# Patient Record
Sex: Female | Born: 1970 | Race: White | Hispanic: No | Marital: Single | Smoking: Current every day smoker
Health system: Southern US, Community
[De-identification: ages and names within clinical notes are randomized; demographics above are authoritative.]

## PROBLEM LIST (undated history)

## (undated) DIAGNOSIS — E119 Type 2 diabetes mellitus without complications: Secondary | ICD-10-CM

## (undated) DIAGNOSIS — F32A Depression, unspecified: Secondary | ICD-10-CM

## (undated) DIAGNOSIS — R87629 Unspecified abnormal cytological findings in specimens from vagina: Secondary | ICD-10-CM

## (undated) DIAGNOSIS — F329 Major depressive disorder, single episode, unspecified: Secondary | ICD-10-CM

## (undated) HISTORY — PX: TONSILLECTOMY: SUR1361

## (undated) HISTORY — PX: CHOLECYSTECTOMY: SHX55

## (undated) HISTORY — DX: Unspecified abnormal cytological findings in specimens from vagina: R87.629

## (undated) HISTORY — PX: TONSILLECTOMY AND ADENOIDECTOMY: SHX28

---

## 2018-01-30 ENCOUNTER — Emergency Department (HOSPITAL_COMMUNITY)
Admission: EM | Admit: 2018-01-30 | Discharge: 2018-01-30 | Disposition: A | Discharge status: Home / Self Care | Payer: Medicaid Other | Attending: Emergency Medicine | Admitting: Emergency Medicine

## 2018-01-30 ENCOUNTER — Emergency Department (HOSPITAL_COMMUNITY): Payer: Medicaid Other

## 2018-01-30 ENCOUNTER — Encounter (HOSPITAL_COMMUNITY): Payer: Self-pay | Admitting: Emergency Medicine

## 2018-01-30 ENCOUNTER — Other Ambulatory Visit: Payer: Self-pay

## 2018-01-30 DIAGNOSIS — Y999 Unspecified external cause status: Secondary | ICD-10-CM | POA: Diagnosis not present

## 2018-01-30 DIAGNOSIS — W010XXA Fall on same level from slipping, tripping and stumbling without subsequent striking against object, initial encounter: Secondary | ICD-10-CM | POA: Diagnosis not present

## 2018-01-30 DIAGNOSIS — Y929 Unspecified place or not applicable: Secondary | ICD-10-CM | POA: Insufficient documentation

## 2018-01-30 DIAGNOSIS — S52592A Other fractures of lower end of left radius, initial encounter for closed fracture: Secondary | ICD-10-CM | POA: Diagnosis not present

## 2018-01-30 DIAGNOSIS — F17210 Nicotine dependence, cigarettes, uncomplicated: Secondary | ICD-10-CM | POA: Insufficient documentation

## 2018-01-30 DIAGNOSIS — Y939 Activity, unspecified: Secondary | ICD-10-CM | POA: Diagnosis not present

## 2018-01-30 DIAGNOSIS — S62102A Fracture of unspecified carpal bone, left wrist, initial encounter for closed fracture: Secondary | ICD-10-CM

## 2018-01-30 DIAGNOSIS — S6992XA Unspecified injury of left wrist, hand and finger(s), initial encounter: Secondary | ICD-10-CM | POA: Diagnosis present

## 2018-01-30 MED ORDER — IBUPROFEN 200 MG PO TABS
600.0000 mg | ORAL_TABLET | Freq: Once | ORAL | Status: AC | PRN
  Administered 2018-01-30: 600 mg via ORAL
  Filled 2018-01-30: qty 3

## 2018-01-30 MED ORDER — HYDROCODONE-ACETAMINOPHEN 5-325 MG PO TABS: 1.0000 | ORAL_TABLET | Freq: Four times a day (QID) | ORAL | 0 refills | Status: DC | PRN

## 2018-01-30 MED ORDER — HYDROCODONE-ACETAMINOPHEN 5-325 MG PO TABS
2.0000 | ORAL_TABLET | Freq: Once | ORAL | Status: AC
  Administered 2018-01-30: 2 via ORAL
  Filled 2018-01-30: qty 2

## 2018-01-30 NOTE — ED Triage Notes (Signed)
Pt presents s/p fall with complaints of left wrist pain. Patient was out with friends when she slipped and fell and caught herself with her left hand. No head injury or LOC. Swelling noted, PMS in tact.

## 2018-01-30 NOTE — ED Provider Notes (Signed)
[facility]-EMERGENCY DEPT Provider Note   CSN: [id number] Arrival date & time: 01/30/18  [id number]     History   Chief Complaint Chief Complaint  Patient presents with  . Fall  . Wrist Injury    HPI Debbie Hunter is a 47 y.o. female.  Patient presents to the emergency department with a chief complaint of left wrist pain.  She states that she fell on an outstretched hand after tripping over a shoe.  She complains of moderate pain.  Denies any numbness or tingling.  Denies any treatments prior to arrival.  The history is provided by the patient. No language interpreter was used.    History reviewed. No pertinent past medical history.  There are no active problems to display for this patient.   Past Surgical History:  Procedure Laterality Date  . CHOLECYSTECTOMY    . TONSILLECTOMY    . TONSILLECTOMY AND ADENOIDECTOMY       OB History   No obstetric history on file.      Home Medications    Prior to Admission medications   Not on File    Family History No family history on file.  Social History Social History   Tobacco Use  . Smoking status: Current Every Day Smoker    Packs/day: 0.50    Types: Cigarettes  . Smokeless tobacco: Never Used  Substance Use Topics  . Alcohol use: Yes  . Drug use: Never     Allergies   Patient has no known allergies.   Review of Systems Review of Systems  All other systems reviewed and are negative.    Physical Exam Updated Vital Signs BP 104/63 (BP Location: Left Arm)   Pulse 75   Temp 97.6 F (36.4 C) (Oral)   Resp 15   Ht 5\' 7"  (1.702 m)   Wt 89.4 kg   LMP 01/03/2018   SpO2 96%   BMI 30.85 kg/m   Physical Exam Nursing note and vitals reviewed.  Constitutional: Pt appears well-developed and well-nourished. No distress.  HENT:  Head: Normocephalic and atraumatic.  Eyes: Conjunctivae are normal.  Neck: Normal range of motion.  Cardiovascular: Normal rate, regular rhythm. Intact  distal pulses.   Capillary refill < 3 sec.  Pulmonary/Chest: Effort normal and breath sounds normal.  Musculoskeletal:  Left wrist Pt exhibits tender to palpation over the radial aspect with mild swelling.   ROM: Limited secondary to pain  Strength: Deferred Neurological: Pt  is alert. Coordination normal.  Sensation: 5/5 Skin: Skin is warm and dry. Pt is not diaphoretic.  No evidence of open wound or skin tenting Psychiatric: Pt has a normal mood and affect.     ED Treatments / Results  Labs (all labs ordered are listed, but only abnormal results are displayed) Labs Reviewed - No data to display  EKG None  Radiology Dg Wrist Complete Left  Result Date: 01/30/2018 CLINICAL DATA:  Status post fall, with left wrist pain. Initial encounter. EXAM: LEFT WRIST - COMPLETE 3+ VIEW COMPARISON:  None. FINDINGS: There is a comminuted fracture at the distal radius, extending to the radiocarpal joint. There is also a minimally displaced fracture of the ulnar styloid. No significant angulation is seen. Surrounding soft tissue swelling is noted. The carpal rows are intact, and demonstrate normal alignment. The joint spaces are preserved. IMPRESSION: Comminuted fracture at the distal radius, extending to the radiocarpal joint. Minimally displaced fracture of the ulnar styloid. Electronically Signed   By: Beryle BeamsJeffery  Chang M.D.  On: 01/30/2018 05:16    Procedures Procedures (including critical care time)  Medications Ordered in ED Medications  HYDROcodone-acetaminophen (NORCO/VICODIN) 5-325 MG per tablet 2 tablet (has no administration in time range)  ibuprofen (ADVIL,MOTRIN) tablet 600 mg (600 mg Oral Given 01/30/18 0511)     Initial Impression / Assessment and Plan / ED Course  I have reviewed the triage vital signs and the nursing notes.  Pertinent labs & imaging results that were available during my care of the patient were reviewed by me and considered in my medical decision making (see  chart for details).     Plain films reveal comminuted fracture of the distal radius, there is also a minimally displaced ulnar styloid fracture.  Patient given sugar tong splint and sling and instructed to follow-up with orthopedic hand surgery.  Final Clinical Impressions(s) / ED Diagnoses   Final diagnoses:  Closed fracture of left wrist, initial encounter    ED Discharge Orders         Ordered    HYDROcodone-acetaminophen (NORCO/VICODIN) 5-325 MG tablet  Every 6 hours PRN     01/30/18 0607           Roxy HorsemanBrowning, Nickalos Petersen, PA-C 01/30/18 [id number]    Zadie RhineWickline, Donald, MD 01/30/18 [phone]

## 2018-02-01 ENCOUNTER — Other Ambulatory Visit: Payer: Self-pay | Admitting: Orthopedic Surgery

## 2018-02-01 ENCOUNTER — Other Ambulatory Visit: Payer: Self-pay

## 2018-02-01 ENCOUNTER — Encounter (HOSPITAL_BASED_OUTPATIENT_CLINIC_OR_DEPARTMENT_OTHER): Payer: Self-pay | Admitting: *Deleted

## 2018-02-04 ENCOUNTER — Encounter (HOSPITAL_BASED_OUTPATIENT_CLINIC_OR_DEPARTMENT_OTHER)
Admission: RE | Disposition: A | Discharge status: Home / Self Care | Payer: Self-pay | Source: Home / Self Care | Attending: Orthopedic Surgery

## 2018-02-04 ENCOUNTER — Ambulatory Visit (HOSPITAL_BASED_OUTPATIENT_CLINIC_OR_DEPARTMENT_OTHER): Payer: Medicaid Other | Admitting: Certified Registered"

## 2018-02-04 ENCOUNTER — Encounter (HOSPITAL_BASED_OUTPATIENT_CLINIC_OR_DEPARTMENT_OTHER): Payer: Self-pay | Admitting: Certified Registered"

## 2018-02-04 ENCOUNTER — Other Ambulatory Visit: Payer: Self-pay

## 2018-02-04 ENCOUNTER — Ambulatory Visit (HOSPITAL_BASED_OUTPATIENT_CLINIC_OR_DEPARTMENT_OTHER)
Admission: RE | Admit: 2018-02-04 | Discharge: 2018-02-04 | Disposition: A | Discharge status: Home / Self Care | Payer: Medicaid Other | Attending: Orthopedic Surgery | Admitting: Orthopedic Surgery

## 2018-02-04 DIAGNOSIS — Z79899 Other long term (current) drug therapy: Secondary | ICD-10-CM | POA: Insufficient documentation

## 2018-02-04 DIAGNOSIS — Z7984 Long term (current) use of oral hypoglycemic drugs: Secondary | ICD-10-CM | POA: Diagnosis not present

## 2018-02-04 DIAGNOSIS — F17210 Nicotine dependence, cigarettes, uncomplicated: Secondary | ICD-10-CM | POA: Diagnosis not present

## 2018-02-04 DIAGNOSIS — W1830XA Fall on same level, unspecified, initial encounter: Secondary | ICD-10-CM | POA: Insufficient documentation

## 2018-02-04 DIAGNOSIS — S52572A Other intraarticular fracture of lower end of left radius, initial encounter for closed fracture: Secondary | ICD-10-CM | POA: Diagnosis not present

## 2018-02-04 DIAGNOSIS — E119 Type 2 diabetes mellitus without complications: Secondary | ICD-10-CM | POA: Diagnosis not present

## 2018-02-04 DIAGNOSIS — F329 Major depressive disorder, single episode, unspecified: Secondary | ICD-10-CM | POA: Diagnosis not present

## 2018-02-04 DIAGNOSIS — S52572K Other intraarticular fracture of lower end of left radius, subsequent encounter for closed fracture with nonunion: Secondary | ICD-10-CM | POA: Diagnosis present

## 2018-02-04 HISTORY — DX: Depression, unspecified: F32.A

## 2018-02-04 HISTORY — PX: OPEN REDUCTION INTERNAL FIXATION (ORIF) DISTAL RADIAL FRACTURE: SHX5989

## 2018-02-04 HISTORY — DX: Type 2 diabetes mellitus without complications: E11.9

## 2018-02-04 HISTORY — DX: Major depressive disorder, single episode, unspecified: F32.9

## 2018-02-04 SURGERY — OPEN REDUCTION INTERNAL FIXATION (ORIF) DISTAL RADIUS FRACTURE
Anesthesia: Monitor Anesthesia Care | Site: Arm Lower | Laterality: Left

## 2018-02-04 MED ORDER — CHLORHEXIDINE GLUCONATE 4 % EX LIQD: 60.0000 mL | Freq: Once | CUTANEOUS | Status: DC

## 2018-02-04 MED ORDER — MIDAZOLAM HCL 2 MG/2ML IJ SOLN
INTRAMUSCULAR | Status: AC
  Filled 2018-02-04: qty 2

## 2018-02-04 MED ORDER — HYDROMORPHONE HCL 1 MG/ML IJ SOLN: 0.2500 mg | INTRAMUSCULAR | Status: DC | PRN

## 2018-02-04 MED ORDER — CEFAZOLIN SODIUM-DEXTROSE 2-4 GM/100ML-% IV SOLN
INTRAVENOUS | Status: AC
  Filled 2018-02-04: qty 100

## 2018-02-04 MED ORDER — LIDOCAINE HCL (CARDIAC) PF 100 MG/5ML IV SOSY
PREFILLED_SYRINGE | INTRAVENOUS | Status: DC | PRN
  Administered 2018-02-04: 30 mg via INTRAVENOUS

## 2018-02-04 MED ORDER — FENTANYL CITRATE (PF) 100 MCG/2ML IJ SOLN
50.0000 ug | INTRAMUSCULAR | Status: DC | PRN
  Administered 2018-02-04: 100 ug via INTRAVENOUS

## 2018-02-04 MED ORDER — MEPERIDINE HCL 25 MG/ML IJ SOLN: 6.2500 mg | INTRAMUSCULAR | Status: DC | PRN

## 2018-02-04 MED ORDER — CEFAZOLIN SODIUM-DEXTROSE 2-4 GM/100ML-% IV SOLN
2.0000 g | INTRAVENOUS | Status: AC
  Administered 2018-02-04: 2 g via INTRAVENOUS

## 2018-02-04 MED ORDER — 0.9 % SODIUM CHLORIDE (POUR BTL) OPTIME
TOPICAL | Status: DC | PRN
  Administered 2018-02-04: 1000 mL

## 2018-02-04 MED ORDER — FENTANYL CITRATE (PF) 100 MCG/2ML IJ SOLN
INTRAMUSCULAR | Status: AC
  Filled 2018-02-04: qty 2

## 2018-02-04 MED ORDER — MIDAZOLAM HCL 2 MG/2ML IJ SOLN
1.0000 mg | INTRAMUSCULAR | Status: DC | PRN
  Administered 2018-02-04: 2 mg via INTRAVENOUS

## 2018-02-04 MED ORDER — PROPOFOL 500 MG/50ML IV EMUL
INTRAVENOUS | Status: DC | PRN
  Administered 2018-02-04: 4 ug/kg/min via INTRAVENOUS
  Administered 2018-02-04: 75 ug/kg/min via INTRAVENOUS

## 2018-02-04 MED ORDER — SCOPOLAMINE 1 MG/3DAYS TD PT72: 1.0000 | MEDICATED_PATCH | Freq: Once | TRANSDERMAL | Status: DC | PRN

## 2018-02-04 MED ORDER — ROPIVACAINE HCL 5 MG/ML IJ SOLN
INTRAMUSCULAR | Status: DC | PRN
  Administered 2018-02-04: 30 mL via PERINEURAL

## 2018-02-04 MED ORDER — LACTATED RINGERS IV SOLN
INTRAVENOUS | Status: DC
  Administered 2018-02-04: 09:00:00 via INTRAVENOUS

## 2018-02-04 MED ORDER — OXYCODONE HCL 5 MG/5ML PO SOLN: 5.0000 mg | Freq: Once | ORAL | Status: DC | PRN

## 2018-02-04 MED ORDER — PROMETHAZINE HCL 25 MG/ML IJ SOLN: 6.2500 mg | INTRAMUSCULAR | Status: DC | PRN

## 2018-02-04 MED ORDER — HYDROCODONE-ACETAMINOPHEN 5-325 MG PO TABS: ORAL_TABLET | ORAL | 0 refills | Status: DC

## 2018-02-04 MED ORDER — OXYCODONE HCL 5 MG PO TABS: 5.0000 mg | ORAL_TABLET | Freq: Once | ORAL | Status: DC | PRN

## 2018-02-04 SURGICAL SUPPLY — 58 items
BANDAGE ACE 3X5.8 VEL STRL LF (GAUZE/BANDAGES/DRESSINGS) ×3 IMPLANT
BIT DRILL 2.0 LNG QUCK RELEASE (BIT) ×1 IMPLANT
BIT DRILL 2.8X5 QR DISP (BIT) ×3 IMPLANT
BLADE SURG 15 STRL LF DISP TIS (BLADE) ×2 IMPLANT
BLADE SURG 15 STRL SS (BLADE) ×4
BNDG ESMARK 4X9 LF (GAUZE/BANDAGES/DRESSINGS) ×3 IMPLANT
BNDG GAUZE ELAST 4 BULKY (GAUZE/BANDAGES/DRESSINGS) ×3 IMPLANT
BNDG PLASTER X FAST 3X3 WHT LF (CAST SUPPLIES) IMPLANT
CHLORAPREP W/TINT 26ML (MISCELLANEOUS) ×3 IMPLANT
CORD BIPOLAR FORCEPS 12FT (ELECTRODE) ×3 IMPLANT
COVER BACK TABLE 60X90IN (DRAPES) ×3 IMPLANT
COVER MAYO STAND STRL (DRAPES) ×3 IMPLANT
COVER WAND RF STERILE (DRAPES) IMPLANT
CUFF TOURNIQUET SINGLE 18IN (TOURNIQUET CUFF) IMPLANT
CUFF TOURNIQUET SINGLE 24IN (TOURNIQUET CUFF) ×3 IMPLANT
DRAPE EXTREMITY T 121X128X90 (DRAPE) ×3 IMPLANT
DRAPE OEC MINIVIEW 54X84 (DRAPES) ×3 IMPLANT
DRAPE SURG 17X23 STRL (DRAPES) ×3 IMPLANT
DRILL 2.0 LNG QUICK RELEASE (BIT) ×3
GAUZE SPONGE 4X4 12PLY STRL (GAUZE/BANDAGES/DRESSINGS) ×3 IMPLANT
GAUZE XEROFORM 1X8 LF (GAUZE/BANDAGES/DRESSINGS) ×3 IMPLANT
GLOVE BIO SURGEON STRL SZ7.5 (GLOVE) ×3 IMPLANT
GLOVE BIOGEL PI IND STRL 8 (GLOVE) ×1 IMPLANT
GLOVE BIOGEL PI IND STRL 8.5 (GLOVE) IMPLANT
GLOVE BIOGEL PI INDICATOR 8 (GLOVE) ×2
GLOVE BIOGEL PI INDICATOR 8.5 (GLOVE)
GLOVE SURG ORTHO 8.0 STRL STRW (GLOVE) IMPLANT
GOWN STRL REUS W/ TWL LRG LVL3 (GOWN DISPOSABLE) ×1 IMPLANT
GOWN STRL REUS W/TWL LRG LVL3 (GOWN DISPOSABLE) ×2
GOWN STRL REUS W/TWL XL LVL3 (GOWN DISPOSABLE) ×3 IMPLANT
GUIDEWIRE ORTHO 0.054X6 (WIRE) ×9 IMPLANT
NEEDLE HYPO 25X1 1.5 SAFETY (NEEDLE) IMPLANT
NS IRRIG 1000ML POUR BTL (IV SOLUTION) ×3 IMPLANT
PACK BASIN DAY SURGERY FS (CUSTOM PROCEDURE TRAY) ×3 IMPLANT
PAD CAST 3X4 CTTN HI CHSV (CAST SUPPLIES) ×1 IMPLANT
PADDING CAST COTTON 3X4 STRL (CAST SUPPLIES) ×2
PLATE ACU LOC PROX STD LEFT (Plate) ×3 IMPLANT
SCREW CORT FT 18X2.3XLCK HEX (Screw) ×2 IMPLANT
SCREW CORTICAL LOCKING 2.3X18M (Screw) ×8 IMPLANT
SCREW CORTICAL LOCKING 2.3X20M (Screw) ×4 IMPLANT
SCREW FX18X2.3XSMTH LCK NS CRT (Screw) ×2 IMPLANT
SCREW FX20X2.3XSMTH LCK NS CRT (Screw) ×2 IMPLANT
SCREW HEX 3.5X15 NLCKG STRL (Screw) ×1 IMPLANT
SCREW HEX 3.5X15MM (Screw) ×3 IMPLANT
SCREW HEXALOBE NON-LOCK 3.5X16 (Screw) ×3 IMPLANT
SCREW NONLOCK HEX 3.5X12 (Screw) ×3 IMPLANT
SLEEVE SCD COMPRESS KNEE MED (MISCELLANEOUS) ×3 IMPLANT
SLING ARM FOAM STRAP LRG (SOFTGOODS) ×3 IMPLANT
SPLINT PLASTER CAST XFAST 3X15 (CAST SUPPLIES) ×10 IMPLANT
SPLINT PLASTER XTRA FASTSET 3X (CAST SUPPLIES) ×20
STOCKINETTE 4X48 STRL (DRAPES) ×3 IMPLANT
SUT ETHILON 4 0 PS 2 18 (SUTURE) ×3 IMPLANT
SUT VICRYL 4-0 PS2 18IN ABS (SUTURE) ×3 IMPLANT
SYR BULB 3OZ (MISCELLANEOUS) ×3 IMPLANT
SYR CONTROL 10ML LL (SYRINGE) IMPLANT
TOWEL GREEN STERILE FF (TOWEL DISPOSABLE) ×6 IMPLANT
TOWEL OR NON WOVEN STRL DISP B (DISPOSABLE) ×3 IMPLANT
UNDERPAD 30X30 (UNDERPADS AND DIAPERS) ×3 IMPLANT

## 2018-02-04 NOTE — Discharge Instructions (Addendum)
Post Anesthesia Home Care Instructions  Activity: Get plenty of rest for the remainder of the day. A responsible individual must stay with you for 24 hours following the procedure.  For the next 24 hours, DO NOT: -Drive a car -Operate machinery -Drink alcoholic beverages -Take any medication unless instructed by your physician -Make any legal decisions or sign important papers.  Meals: Start with liquid foods such as gelatin or soup. Progress to regular foods as tolerated. Avoid greasy, spicy, heavy foods. If nausea and/or vomiting occur, drink only clear liquids until the nausea and/or vomiting subsides. Call your physician if vomiting continues.  Special Instructions/Symptoms: Your throat may feel dry or sore from the anesthesia or the breathing tube placed in your throat during surgery. If this causes discomfort, gargle with warm salt water. The discomfort should disappear within 24 hours.  If you had a scopolamine patch placed behind your ear for the management of post- operative nausea and/or vomiting:  1. The medication in the patch is effective for 72 hours, after which it should be removed.  Wrap patch in a tissue and discard in the trash. Wash hands thoroughly with soap and water. 2. You may remove the patch earlier than 72 hours if you experience unpleasant side effects which may include dry mouth, dizziness or visual disturbances. 3. Avoid touching the patch. Wash your hands with soap and water after contact with the patch.      Regional Anesthesia Blocks  1. Numbness or the inability to move the "blocked" extremity may last from 3-48 hours after placement. The length of time depends on the medication injected and your individual response to the medication. If the numbness is not going away after 48 hours, call your surgeon.  2. The extremity that is blocked will need to be protected until the numbness is gone and the  Strength has returned. Because you cannot feel it, you  will need to take extra care to avoid injury. Because it may be weak, you may have difficulty moving it or using it. You may not know what position it is in without looking at it while the block is in effect.  3. For blocks in the legs and feet, returning to weight bearing and walking needs to be done carefully. You will need to wait until the numbness is entirely gone and the strength has returned. You should be able to move your leg and foot normally before you try and bear weight or walk. You will need someone to be with you when you first try to ensure you do not fall and possibly risk injury.  4. Bruising and tenderness at the needle site are common side effects and will resolve in a few days.  5. Persistent numbness or new problems with movement should be communicated to the surgeon or the [facility] ([phone])/ [facility] ([phone]).    Hand Center Instructions Hand Surgery  Wound Care: Keep your hand elevated above the level of your heart.  Do not allow it to dangle by your side.  Keep the dressing dry and do not remove it unless your doctor advises you to do so.  He will usually change it at the time of your post-op visit.  Moving your fingers is advised to stimulate circulation but will depend on the site of your surgery.  If you have a splint applied, your doctor will advise you regarding movement.  Activity: Do not drive or operate machinery today.  Rest today and   then you may return to your normal activity and work as indicated by your physician.  Diet:  Drink liquids today or eat a light diet.  You may resume a regular diet tomorrow.    General expectations: Pain for two to three days. Fingers may become slightly swollen.  Call your doctor if any of the following occur: Severe pain not relieved by pain medication. Elevated temperature. Dressing soaked with blood. Inability to move fingers. White or bluish color to fingers.  

## 2018-02-04 NOTE — Progress Notes (Signed)
Assisted Dr. Miller with left, ultrasound guided, supraclavicular block. Side rails up, monitors on throughout procedure. See vital signs in flow sheet. Tolerated Procedure well. 

## 2018-02-04 NOTE — Anesthesia Procedure Notes (Signed)
Anesthesia Regional Block: Supraclavicular block   Pre-Anesthetic Checklist: ,, timeout performed, Correct Patient, Correct Site, Correct Laterality, Correct Procedure, Correct Position, site marked, Risks and benefits discussed,  Surgical consent,  Pre-op evaluation,  At surgeon's request and post-op pain management  Laterality: Left  Prep: chloraprep       Needles:  Injection technique: Single-shot  Needle Type: Stimiplex     Needle Length: 9cm  Needle Gauge: 21     Additional Needles:   Procedures:,,,, ultrasound used (permanent image in chart),,,,  Narrative:  Start time: 02/04/2018 9:11 AM End time: 02/04/2018 9:16 AM Injection made incrementally with aspirations every 5 mL.  Performed by: Personally  Anesthesiologist: Lowella CurbMiller, Bernisha Verma Ray, MD

## 2018-02-04 NOTE — Anesthesia Procedure Notes (Signed)
Procedure Name: MAC Date/Time: 02/04/2018 10:35 AM Performed by: Signe Colt, CRNA Pre-anesthesia Checklist: Patient identified, Emergency Drugs available, Suction available and Patient being monitored Patient Re-evaluated:Patient Re-evaluated prior to induction Oxygen Delivery Method: Simple face mask

## 2018-02-04 NOTE — Transfer of Care (Signed)
Immediate Anesthesia Transfer of Care Note  Patient: Hydrographic surveyor  Procedure(s) Performed: OPEN REDUCTION INTERNAL FIXATION (ORIF) LEFT DISTAL RADIAL FRACTURE (Left Arm Lower)  Patient Location: PACU  Anesthesia Type:MAC combined with regional for post-op pain  Level of Consciousness: awake, alert , oriented and patient cooperative  Airway & Oxygen Therapy: Patient Spontanous Breathing and Patient connected to face mask oxygen  Post-op Assessment: Report given to RN and Post -op Vital signs reviewed and stable  Post vital signs: Reviewed and stable  Last Vitals:  Vitals Value Taken Time  BP    Temp    Pulse 62 02/04/2018 11:36 AM  Resp 16 02/04/2018 11:36 AM  SpO2 99 % 02/04/2018 11:36 AM  Vitals shown include unvalidated device data.  Last Pain:  Vitals:   02/04/18 0844  TempSrc: Oral  PainSc: 2          Complications: No apparent anesthesia complications

## 2018-02-04 NOTE — H&P (Signed)
  Debbie Hunter is an 47 y.o. female.   Chief Complaint: left wrist fracture HPI: 47 yo female states she fell from standing height 1 week ago injuring left wrist.  Seen in ED where XR revealed distal radius fracture.  Splinted and followed up in office.  She wishes to proceed with operative fixation.  Allergies: No Known Allergies  Past Medical History:  Diagnosis Date  . Depression   . Diabetes mellitus without complication [facility](HCC)     Past Surgical History:  Procedure Laterality Date  . CHOLECYSTECTOMY    . TONSILLECTOMY    . TONSILLECTOMY AND ADENOIDECTOMY      Family History: History reviewed. No pertinent family history.  Social History:   reports that she has been smoking cigarettes. She has been smoking about 0.50 packs per day. She has never used smokeless tobacco. She reports current alcohol use. She reports that she does not use drugs.  Medications: Medications Prior to Admission  Medication Sig Dispense Refill  . cholecalciferol (VITAMIN D3) 25 MCG (1000 UT) tablet Take 1,000 Units by mouth daily.    Marland Kitchen. escitalopram (LEXAPRO) 20 MG tablet Take 20 mg by mouth daily.    Marland Kitchen. HYDROcodone-acetaminophen (NORCO/VICODIN) 5-325 MG tablet Take 1-2 tablets by mouth every 6 (six) hours as needed. 15 tablet 0  . metFORMIN (GLUCOPHAGE) 1000 MG tablet Take 1,000 mg by mouth daily with breakfast.      No results found for this or any previous visit (from the past 48 hour(s)).  No results found.   A comprehensive review of systems was negative.  Blood pressure (!) 103/58, pulse 66, temperature 98 F (36.7 C), temperature source Oral, resp. rate 16, height 5\' 7"  (1.702 m), weight (!) 143.2 kg, last menstrual period 01/28/2018, SpO2 98 %.  General appearance: alert, cooperative and appears stated age Head: Normocephalic, without obvious abnormality, atraumatic Neck: supple, symmetrical, trachea midline Cardio: regular rate and rhythm Resp: clear to auscultation  bilaterally Extremities: Intact sensation and capillary refill all digits.  +epl/fpl/io.  No wounds.  Pulses: 2+ and symmetric Skin: Skin color, texture, turgor normal. No rashes or lesions Neurologic: Grossly normal Incision/Wound: none  Assessment/Plan Left distal radius fracture.  Non operative and operative treatment options have been discussed with the patient and patient wishes to proceed with operative treatment. Risks, benefits, and alternatives of surgery have been discussed and the patient agrees with the plan of care.   Betha LoaKevin Noelle Sease 02/04/2018, 10:17 AM

## 2018-02-04 NOTE — Anesthesia Postprocedure Evaluation (Signed)
Anesthesia Post Note  Patient: Product/process development scientistDeanna Fauble  Procedure(s) Performed: OPEN REDUCTION INTERNAL FIXATION (ORIF) LEFT DISTAL RADIAL FRACTURE (Left Arm Lower)     Patient location during evaluation: PACU Anesthesia Type: Regional Level of consciousness: awake and alert Pain management: pain level controlled Vital Signs Assessment: post-procedure vital signs reviewed and stable Respiratory status: spontaneous breathing, nonlabored ventilation and respiratory function stable Cardiovascular status: blood pressure returned to baseline and stable Postop Assessment: no apparent nausea or vomiting Anesthetic complications: no    Last Vitals:  Vitals:   02/04/18 1147 02/04/18 1225  BP: 120/77 (!) 115/95  Pulse: (!) 59 60  Resp: (!) 23   Temp:  37 C  SpO2: 94% 100%    Last Pain:  Vitals:   02/04/18 1225  TempSrc: Oral  PainSc: 0-No pain                 Lowella CurbWarren Ray Ewan Grau

## 2018-02-04 NOTE — Op Note (Signed)
02/04/2018 [facility]  Operative Note  Pre Op Diagnosis: Left comminuted intraarticular distal radius fracture  Post Op Diagnosis: Left comminuted intraarticular distal radius fracture  Procedure: ORIF Left comminuted intraarticular distal radius fracture, 3 intraarticular fragments  Surgeon: Betha LoaKevin Woodson Macha, MD  Assistant: none  Anesthesia: Regional with sedation  Fluids: Per anesthesia flow sheet  EBL: minimal  Complications: None  Specimen: None  Tourniquet Time:  Total Tourniquet Time Documented: Upper Arm (Left) - 49 minutes Total: Upper Arm (Left) - 49 minutes   Disposition: Stable to PACU  INDICATIONS:  Debbie Hunter is a 47 y.o. female states she fell from standing height 1 week ago injuring left wrist.  Seen in ED where XR revealed distal radius fracture.  Splinted and followed up in office.  We discussed nonoperative and operative treatment options.  She wished to proceed with operative fixation.  Risks, benefits, and alternatives of surgery were discussed including the risk of blood loss; infection; damage to nerves, vessels, tendons, ligaments, bone; failure of surgery; need for additional surgery; complications with wound healing; continued pain; nonunion; malunion; stiffness.  She voiced understanding of these risks and elected to proceed.    OPERATIVE COURSE:  After being identified preoperatively by myself, the patient and I agreed upon the procedure and site of procedure.  Surgical site was marked.  The risks, benefits and alternatives of the surgery were reviewed and she wished to proceed.  Surgical consent had been signed.  She was given IV Ancef as preoperative antibiotic prophylaxis.  She was transferred to the operating room and placed on the operating room table in supine position with the Left upper extremity on an armboard. Sedation was induced by the anesthesiologist.  A regional block had been performed by anesthesia in preoperative holding.   The Left upper extremity was prepped and draped in normal sterile orthopedic fashion.  A surgical pause was performed between the surgeons, anesthesia and operating room staff, and all were in agreement as to the patient, procedure and site of procedure.  Tourniquet at the proximal aspect of the extremity was inflated to 250 mmHg after exsanguination of the limb with an Esmarch bandage.  Standard volar Debbie Hunter approach was used.  The bipolar electrocautery was used to obtain hemostasis.  The superficial and deep portions of the FCR tendon sheath were incised, and the FCR and FPL were swept ulnarly to protect the palmar cutaneous branch of the median nerve.  The brachioradialis was released at the radial side of the radius.  The pronator quadratus was released and elevated with the periosteal elevator.  The fracture site was identified and cleared of soft tissue interposition and hematoma.  It was reduced under direct visualization.  There was intraarticular extension creating three intraarticular fragments.  An AcuMed volar distal radial locking plate was selected.  It was secured to the bone with the guidepins.  C-arm was used in AP and lateral projections to ensure appropriate reduction and position of the hardware and adjustments made as necessary.  Standard AO drilling and measuring technique was used.  A single screw was placed in the slotted hole in the shaft of the plate.  The distal holes were filled with locking pegs with the exception of the styloid holes, which were filled with locking screws.  The remaining holes in the shaft of the plate were filled with nonlocking screws.  Good purchase was obtained.  C-arm was used in AP, lateral and oblique projections to ensure appropriate reduction and position of hardware,  which was the case.  There was no intra-articular penetration of hardware.  The wound was copiously irrigated with sterile saline.  Pronator quadratus was repaired back over top of the plate  using 4-0 Vicryl suture.  Vicryl suture was placed in the subcutaneous tissues in an inverted interrupted fashion and the skin was closed with 4-0 nylon in a horizontal mattress fashion.  There was good pronation and supination of the wrist without crepitance.  The wound was then dressed with sterile Xeroform, 4x4s, and wrapped with a Kerlix bandage.  A volar splint was placed and wrapped with Kerlix and Ace bandage.  Tourniquet was deflated at 49 minutes.  Fingertips were pink with brisk capillary refill after deflation of the tourniquet.  Operative drapes were broken down.  The patient was awoken from anesthesia safely.  She was transferred back to the stretcher and taken to the PACU in stable condition.  I will see her back in the office in one week for postoperative followup.  I will give her a prescription for Norco 5/325 1-2 tabs PO q6 hours prn pain, dispense # 30.    Betha LoaKevin Gordie Crumby, MD Electronically signed, 02/04/18

## 2018-02-04 NOTE — OR Nursing (Signed)
Acumed Acu-loc 2 Wrist pating system opened on the sterile field.

## 2018-02-04 NOTE — Anesthesia Preprocedure Evaluation (Signed)
Anesthesia Evaluation  Patient identified by MRN, date of birth, ID band Patient awake    Reviewed: Allergy & Precautions, NPO status , Patient's Chart, lab work & pertinent test results  Airway Mallampati: II  TM Distance: >3 FB Neck ROM: Full    Dental no notable dental hx.    Pulmonary neg pulmonary ROS, Current Smoker,    Pulmonary exam normal breath sounds clear to auscultation       Cardiovascular negative cardio ROS Normal cardiovascular exam Rhythm:Regular Rate:Normal     Neuro/Psych Depression negative neurological ROS  negative psych ROS   GI/Hepatic negative GI ROS, Neg liver ROS,   Endo/Other  diabetes, Type 2, Oral Hypoglycemic AgentsMorbid obesity  Renal/GU negative Renal ROS  negative genitourinary   Musculoskeletal negative musculoskeletal ROS (+)   Abdominal (+) + obese,   Peds negative pediatric ROS (+)  Hematology negative hematology ROS (+)   Anesthesia Other Findings   Reproductive/Obstetrics negative OB ROS                             Anesthesia Physical Anesthesia Plan  ASA: III  Anesthesia Plan: Regional and MAC   Post-op Pain Management:    Induction: Intravenous  PONV Risk Score and Plan: 1 and Ondansetron  Airway Management Planned: Simple Face Mask  Additional Equipment:   Intra-op Plan:   Post-operative Plan:   Informed Consent: I have reviewed the patients History and Physical, chart, labs and discussed the procedure including the risks, benefits and alternatives for the proposed anesthesia with the patient or authorized representative who has indicated his/her understanding and acceptance.   Dental advisory given  Plan Discussed with: CRNA  Anesthesia Plan Comments:         Anesthesia Quick Evaluation

## 2018-02-05 ENCOUNTER — Encounter (HOSPITAL_BASED_OUTPATIENT_CLINIC_OR_DEPARTMENT_OTHER): Payer: Self-pay | Admitting: Orthopedic Surgery

## 2018-02-12 LAB — POCT I-STAT, CHEM 8
BUN: 10 mg/dL (ref 6–20)
Calcium, Ion: 1.14 mmol/L — ABNORMAL LOW (ref 1.15–1.40)
Chloride: 106 mmol/L (ref 98–111)
Creatinine, Ser: 0.6 mg/dL (ref 0.44–1.00)
Glucose, Bld: 94 mg/dL (ref 70–99)
HCT: 44 % (ref 36.0–46.0)
Hemoglobin: 15 g/dL (ref 12.0–15.0)
POTASSIUM: 4.1 mmol/L (ref 3.5–5.1)
Sodium: 139 mmol/L (ref 135–145)
TCO2: 25 mmol/L (ref 22–32)

## 2018-03-31 ENCOUNTER — Ambulatory Visit: Payer: Medicaid Other | Admitting: Obstetrics & Gynecology

## 2018-04-06 ENCOUNTER — Ambulatory Visit: Payer: Medicaid Other | Admitting: Obstetrics and Gynecology

## 2018-04-06 ENCOUNTER — Encounter: Payer: Self-pay | Admitting: Obstetrics and Gynecology

## 2018-04-06 ENCOUNTER — Other Ambulatory Visit (HOSPITAL_COMMUNITY)
Admission: RE | Admit: 2018-04-06 | Discharge: 2018-04-06 | Disposition: A | Discharge status: Home / Self Care | Payer: Medicaid Other | Source: Ambulatory Visit | Attending: Obstetrics and Gynecology | Admitting: Obstetrics and Gynecology

## 2018-04-06 VITALS — BP 126/80 | HR 65 | Ht 67.0 in | Wt 308.0 lb

## 2018-04-06 DIAGNOSIS — Z30432 Encounter for removal of intrauterine contraceptive device: Secondary | ICD-10-CM | POA: Diagnosis not present

## 2018-04-06 DIAGNOSIS — Z01419 Encounter for gynecological examination (general) (routine) without abnormal findings: Secondary | ICD-10-CM

## 2018-04-06 NOTE — Progress Notes (Signed)
Subjective:     Debbie Hunter is a 48 y.o. female P2 with BMI 48 and LMP 04/06/18 who is here for a comprehensive physical exam. The patient reports no problems. She is sexually active currently using Mirena IUD for contraception. She desires to have it removed as she had it for 7 years. She reports a monthly period. She plans on using condoms for contraception for now. Patient denies any pelvic pain or abnormal discharge.  Past Medical History:  Diagnosis Date  . Depression   . Diabetes mellitus without complication [facility])    Past Surgical History:  Procedure Laterality Date  . CHOLECYSTECTOMY    . OPEN REDUCTION INTERNAL FIXATION (ORIF) DISTAL RADIAL FRACTURE Left 02/04/2018   Procedure: OPEN REDUCTION INTERNAL FIXATION (ORIF) LEFT DISTAL RADIAL FRACTURE;  Surgeon: Betha Loa, MD;  Location: [facility];  Service: Orthopedics;  Laterality: Left;  . TONSILLECTOMY    . TONSILLECTOMY AND ADENOIDECTOMY     No family history on file.  Social History   Socioeconomic History  . Marital status: Single    Spouse name: Not on file  . Number of children: Not on file  . Years of education: Not on file  . Highest education level: Not on file  Occupational History  . Not on file  Social Needs  . Financial resource strain: Not on file  . Food insecurity:    Worry: Not on file    Inability: Not on file  . Transportation needs:    Medical: Not on file    Non-medical: Not on file  Tobacco Use  . Smoking status: Current Every Day Smoker    Packs/day: 0.50    Types: Cigarettes  . Smokeless tobacco: Never Used  Substance and Sexual Activity  . Alcohol use: Yes    Comment: weekend  . Drug use: Never  . Sexual activity: Not on file  Lifestyle  . Physical activity:    Days per week: Not on file    Minutes per session: Not on file  . Stress: Not on file  Relationships  . Social connections:    Talks on phone: Not on file    Gets together: Not on file    Attends  religious service: Not on file    Active member of club or organization: Not on file    Attends meetings of clubs or organizations: Not on file    Relationship status: Not on file  . Intimate partner violence:    Fear of current or ex partner: Not on file    Emotionally abused: Not on file    Physically abused: Not on file    Forced sexual activity: Not on file  Other Topics Concern  . Not on file  Social History Narrative  . Not on file   Health Maintenance  Topic Date Due  . HIV Screening  02/08/1988  . TETANUS/TDAP  02/08/1992  . PAP SMEAR-Modifier  02/07/1994  . INFLUENZA VACCINE  09/10/2017       Review of Systems Pertinent items are noted in HPI.   Objective:  Blood pressure 126/80, pulse 65, height 5\' 7"  (1.702 m), weight (!) 308 lb (139.7 kg), last menstrual period 04/06/2018.     GENERAL: Well-developed, well-nourished female in no acute distress.  HEENT: Normocephalic, atraumatic. Sclerae anicteric.  NECK: Supple. Normal thyroid.  LUNGS: Clear to auscultation bilaterally.  HEART: Regular rate and rhythm. BREASTS: Symmetric in size. No palpable masses or lymphadenopathy, skin changes, or nipple drainage. ABDOMEN: Soft, nontender,  nondistended. Obese PELVIC: Normal external female genitalia. Vagina is pink and rugated.  Normal discharge. Normal appearing cervix with IUD strings visualized at the os. Bimanual exam limited secondary to body habitus. No adnexal mass or tenderness. EXTREMITIES: No cyanosis, clubbing, or edema, 2+ distal pulses.    Assessment:    Healthy female exam.      Plan:    pap smear collected Screening mammogram done 3 months ago at [facility]- records requested Patient will be contacted with abnormal results GYNECOLOGY CLINIC PROCEDURE NOTE IUD Removal  Patient identified, informed consent performed, consent signed.  Patient was in the dorsal lithotomy position, normal external genitalia was noted.  A speculum was placed in the patient's  vagina, normal discharge was noted, no lesions. The cervix was visualized, no lesions, no abnormal discharge.  The strings of the IUD were grasped and pulled using ring forceps. The IUD was removed in its entirety. Patient tolerated the procedure well.    RTC prn

## 2018-04-08 LAB — CYTOLOGY - PAP
DIAGNOSIS: NEGATIVE
HPV: NOT DETECTED

## 2020-11-22 IMAGING — CR DG WRIST COMPLETE 3+V*L*
4 series · 4 of 4 positions shown · non-contrast
Comparison: None.

CLINICAL DATA: Status post fall, with left wrist pain. Initial
encounter.

EXAM:
LEFT WRIST - COMPLETE 3+ VIEW

[x wrist pa left]
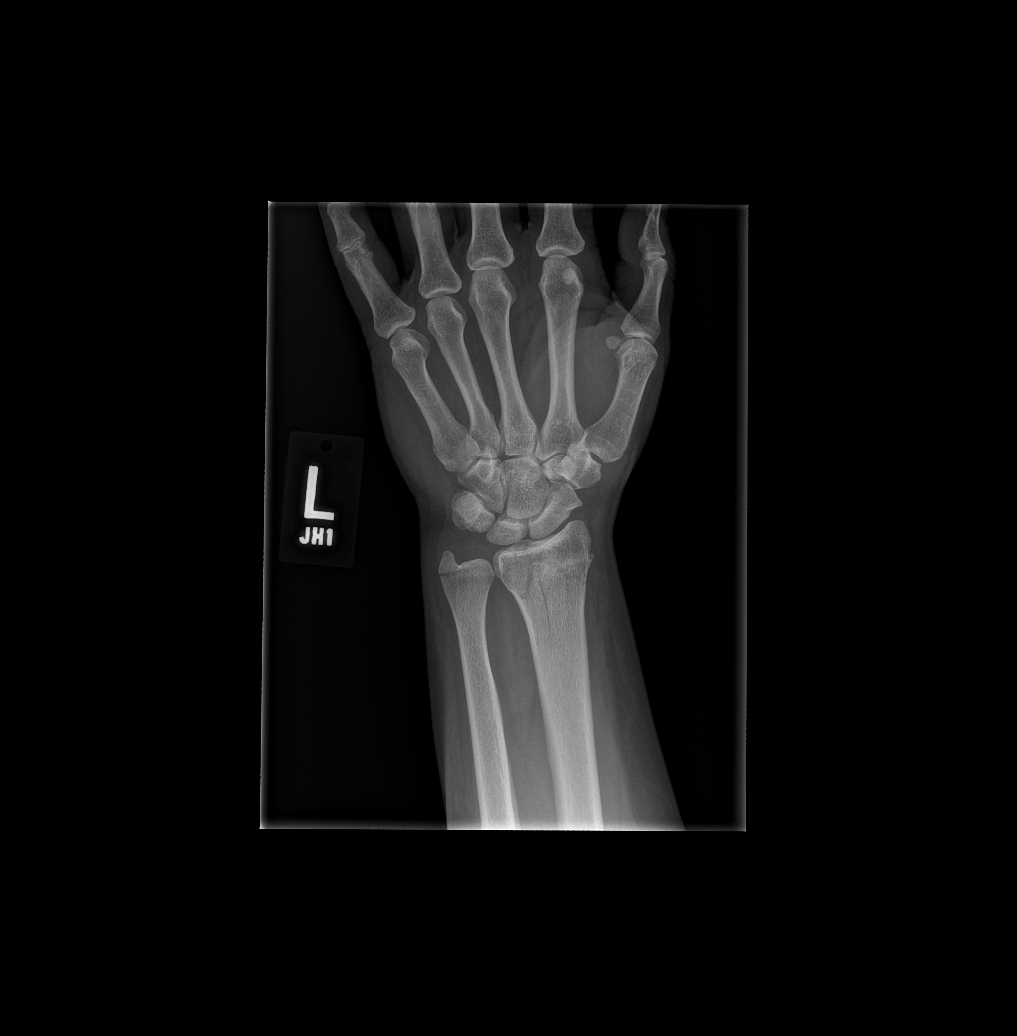

[x wrist obl left]
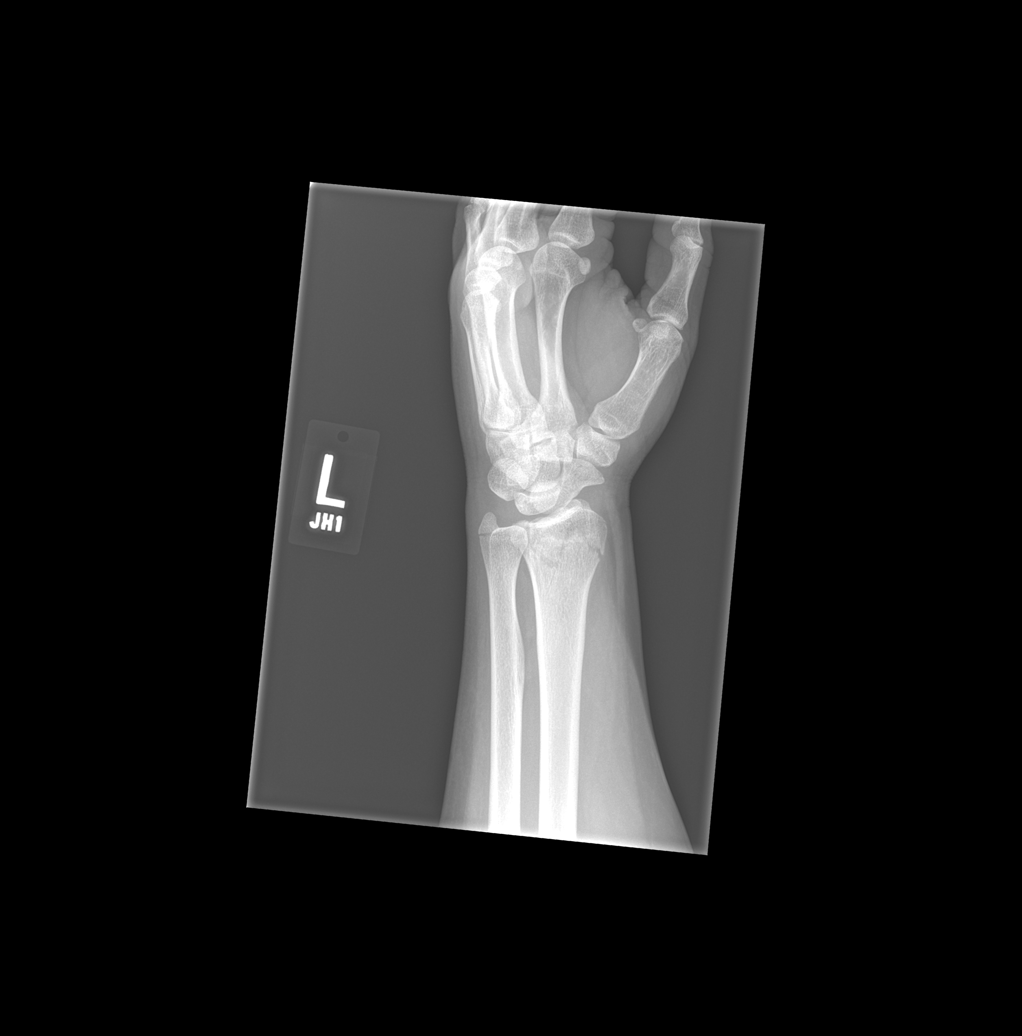

[x wrist lat left]
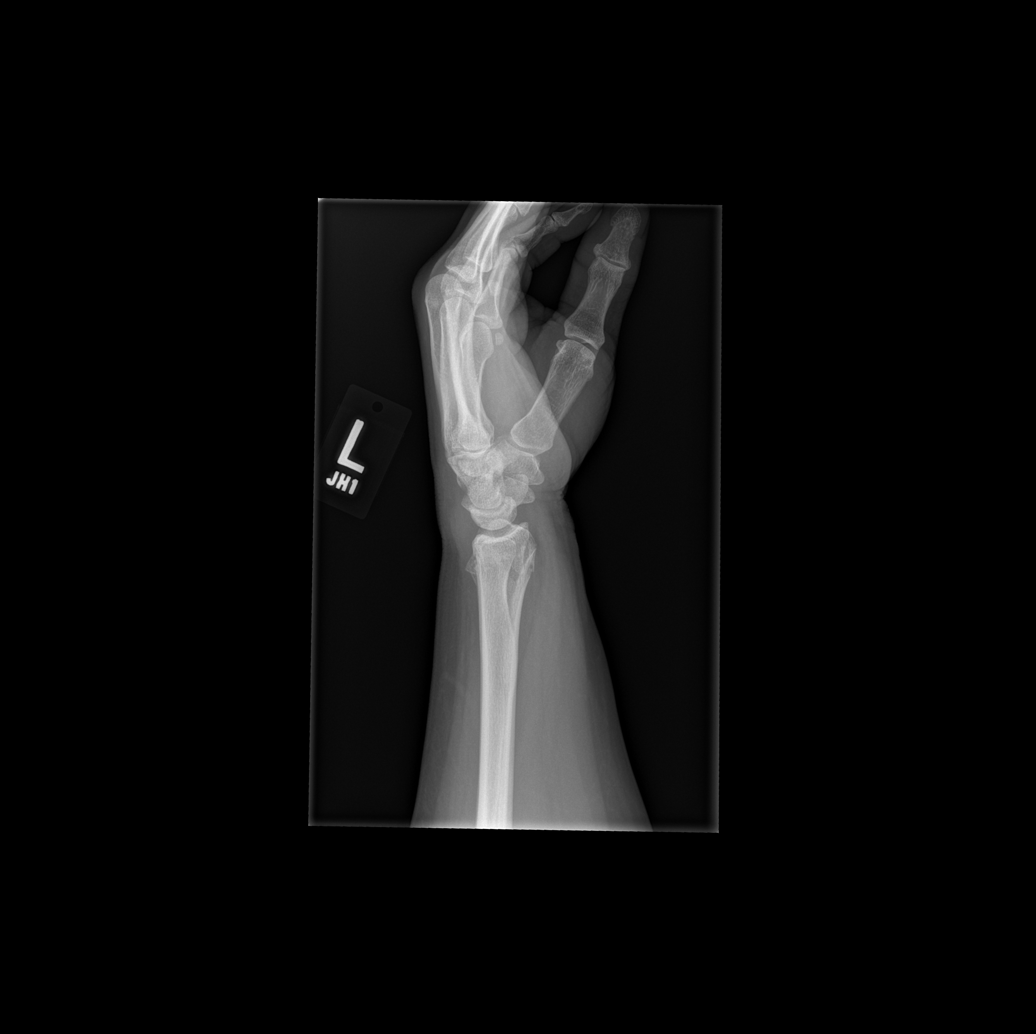

[x wrist navicular view left]
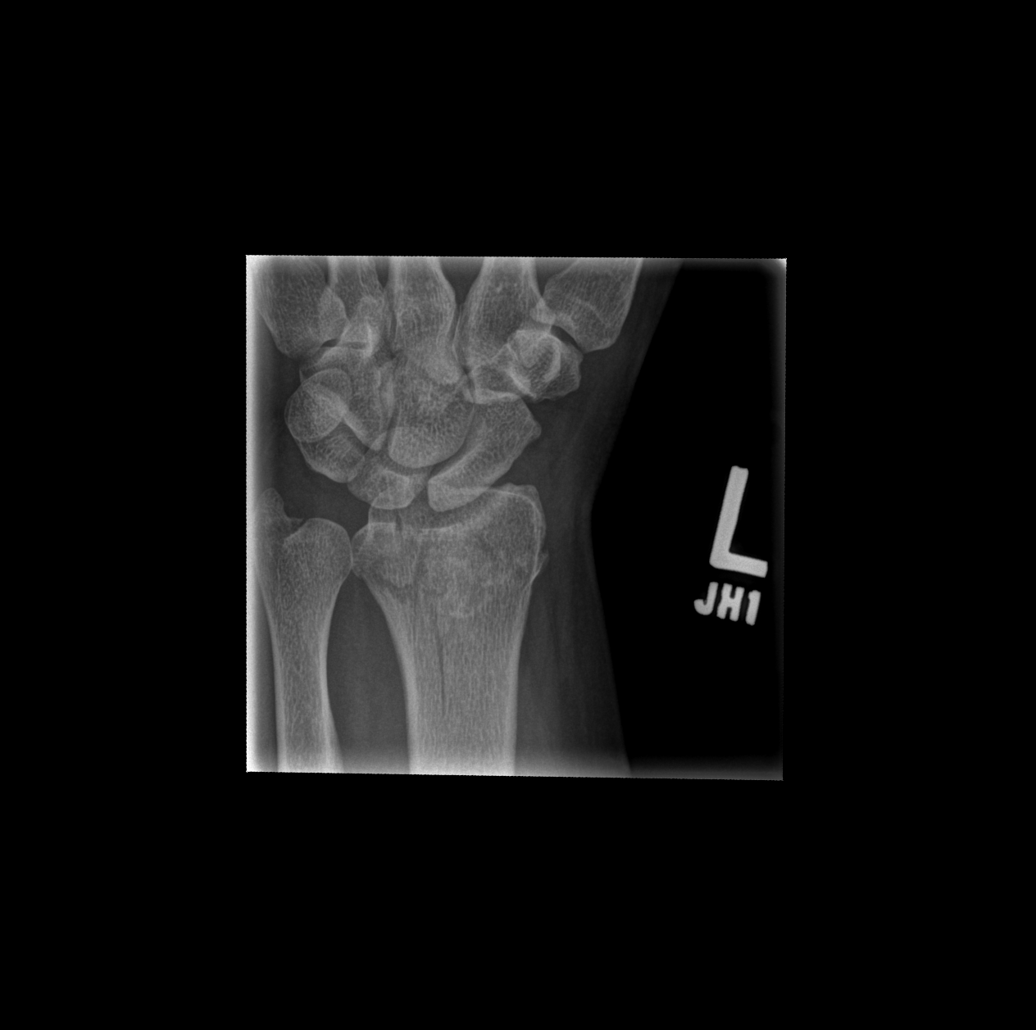

[4 of 4 positions shown; findings below may reference images not displayed]

FINDINGS: There is a comminuted fracture at the distal radius, extending to
the radiocarpal joint. There is also a minimally displaced fracture
of the ulnar styloid. No significant angulation is seen. Surrounding
soft tissue swelling is noted.

The carpal rows are intact, and demonstrate normal alignment. The
joint spaces are preserved.
IMPRESSION: Comminuted fracture at the distal radius, extending to the
radiocarpal joint. Minimally displaced fracture of the ulnar
styloid.
